# Patient Record
Sex: Male | Born: 2000 | Race: Black or African American | Hispanic: No | Marital: Single | Smoking: Never smoker
Health system: Southern US, Community
[De-identification: ages and names within clinical notes are randomized; demographics above are authoritative.]

## PROBLEM LIST (undated history)

## (undated) DIAGNOSIS — Z789 Other specified health status: Secondary | ICD-10-CM

## (undated) HISTORY — DX: Other specified health status: Z78.9

## (undated) HISTORY — PX: TIBIA FRACTURE SURGERY: SHX806

---

## 2020-05-01 ENCOUNTER — Ambulatory Visit (INDEPENDENT_AMBULATORY_CARE_PROVIDER_SITE_OTHER): Payer: 59 | Admitting: Orthopedic Surgery

## 2020-05-01 ENCOUNTER — Ambulatory Visit (INDEPENDENT_AMBULATORY_CARE_PROVIDER_SITE_OTHER): Payer: 59

## 2020-05-01 ENCOUNTER — Encounter: Payer: Self-pay | Admitting: Orthopedic Surgery

## 2020-05-01 DIAGNOSIS — M25561 Pain in right knee: Secondary | ICD-10-CM

## 2020-05-01 DIAGNOSIS — S83511A Sprain of anterior cruciate ligament of right knee, initial encounter: Secondary | ICD-10-CM

## 2020-05-01 NOTE — Progress Notes (Signed)
Office Visit Note   Patient: Casey Shaw           Date of Birth: January 04, 2001           MRN: [id number] Visit Date: 05/01/2020 Requested by: No referring provider defined for this encounter. PCP: Patient, No Pcp Per  Subjective: Chief Complaint  Patient presents with  . 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  He twisted his knee while trying to keep a teammate from getting the ball.  He did feel a pop.  Has lateral pain.  Painful for him to weight-bear.  Been doing ice and ibuprofen.  Reports some lateral pain.              ROS: All systems reviewed are negative as they relate to the chief complaint within the history of present illness.  Patient denies  fevers or chills.   Assessment & Plan: Visit Diagnoses:  1. Right knee pain, unspecified chronicity   2. New tear of anterior cruciate ligament, right, initial encounter     Plan: Impression is right knee ACL tear with effusion and possible lateral collateral ligament partial-thickness tearing.  Difficult to assess whether or not any meniscal damage is present.  No definite posterior lateral rotatory instability on exam today but that will need to be reassessed at his next clinic visit.  He does have anterior drawer and increased laxity anteriorly right versus left.  Plan is MRI scan with likely reconstruction to follow.  We will see him back after that study.  Casey Shaw was present for discussion of treatment plan.  Follow-Up Instructions: Return for after MRI.   Orders:  Orders Placed This Encounter  Procedures  . XR KNEE 3 VIEW RIGHT  . MR Knee Right w/o contrast   No orders of the defined types were placed in this encounter.     Procedures: No procedures performed   Clinical Data: No additional findings.  Objective: Vital Signs: There were no vitals taken for this visit.  Physical Exam:   Constitutional: Patient appears  well-developed HEENT:  Head: Normocephalic Eyes:EOM are normal Neck: Normal range of motion Cardiovascular: Normal rate Pulmonary/chest: Effort normal Neurologic: Patient is alert Skin: Skin is warm Psychiatric: Patient has normal mood and affect    Ortho Exam: Ortho exam demonstrates full active and passive range of motion of the left knee.  Has about 1 mm anterior drawer with solid endpoint on that side.  On the right-hand side he is got 3 to 4 mm anterior drawer with a very soft endpoint.  Collaterals are stable to varus valgus stress at 0 and 30 degrees.  Slightly more laxity to varus stress at 0 degrees on the right compared to the left.  Also the lateral collateral ligament is not as palpable as a discrete structure on the right knee compared to the left when he is placed under varus stress.  Pedal pulses palpable.  PCL intact.  Specialty Comments:  No specialty comments available.  Imaging: No results found.   PMFS History: There are no problems to display for this patient.  History reviewed. No pertinent past medical history.  History reviewed. No pertinent family history.  History reviewed. No pertinent surgical history. Social History   Occupational History  . Not on file  Tobacco Use  . Smoking status: Not on file  Substance and Sexual Activity  . Alcohol use: Not on file  .  Drug use: Not on file  . Sexual activity: Not on file

## 2020-05-07 ENCOUNTER — Ambulatory Visit: Payer: 59 | Admitting: Orthopedic Surgery

## 2020-05-08 ENCOUNTER — Encounter: Payer: Self-pay | Admitting: Radiology

## 2020-05-08 ENCOUNTER — Encounter: Payer: Self-pay | Admitting: Orthopedic Surgery

## 2020-05-09 ENCOUNTER — Other Ambulatory Visit: Payer: Self-pay

## 2020-05-09 ENCOUNTER — Ambulatory Visit (HOSPITAL_COMMUNITY)
Admission: RE | Admit: 2020-05-09 | Discharge: 2020-05-09 | Disposition: A | Discharge status: Home / Self Care | Payer: 59 | Source: Ambulatory Visit | Attending: Orthopedic Surgery | Admitting: Orthopedic Surgery

## 2020-05-09 DIAGNOSIS — M25561 Pain in right knee: Secondary | ICD-10-CM | POA: Insufficient documentation

## 2020-05-20 ENCOUNTER — Other Ambulatory Visit: Payer: Self-pay

## 2020-05-21 ENCOUNTER — Other Ambulatory Visit (HOSPITAL_COMMUNITY)
Admission: RE | Admit: 2020-05-21 | Discharge: 2020-05-21 | Disposition: A | Discharge status: Home / Self Care | Payer: 59 | Source: Ambulatory Visit | Attending: Orthopedic Surgery | Admitting: Orthopedic Surgery

## 2020-05-21 ENCOUNTER — Other Ambulatory Visit: Payer: Self-pay

## 2020-05-21 DIAGNOSIS — Z20822 Contact with and (suspected) exposure to covid-19: Secondary | ICD-10-CM | POA: Insufficient documentation

## 2020-05-21 DIAGNOSIS — Z01812 Encounter for preprocedural laboratory examination: Secondary | ICD-10-CM | POA: Insufficient documentation

## 2020-05-21 DIAGNOSIS — G8918 Other acute postprocedural pain: Secondary | ICD-10-CM

## 2020-05-21 LAB — SARS CORONAVIRUS 2 (TAT 6-24 HRS): SARS Coronavirus 2: NEGATIVE

## 2020-05-21 NOTE — Progress Notes (Signed)
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  He will be flying back home on 05/31/20.  He is coming into the office at 10:30 on 05/30/20 with Dr August Saucer for his post op visit. Dr August Saucer is requesting for patient to have doppler prior to this appointment to make sure he does not have DVT since he will be flying within a week of surgery. Can we please get him scheduled first thing on 07/22 for doppler so results are completed prior to his appointment with Dr August Saucer.

## 2020-05-21 NOTE — Progress Notes (Signed)
Pt is scheduled at the [facility] at [facility] on July 22 at 800am with arrival 745am, pt is aware of appt

## 2020-05-23 ENCOUNTER — Other Ambulatory Visit: Payer: Self-pay

## 2020-05-23 ENCOUNTER — Encounter (HOSPITAL_COMMUNITY): Payer: Self-pay | Admitting: Orthopedic Surgery

## 2020-05-23 NOTE — Progress Notes (Signed)
Spoke with pt for pre-op call. Pt denies any medical history.   Covid test done 05/21/20 and it's negative. Pt states he's been in quarantine since the test was done and will continue until he comes to the hospital tomorrow. Pt states he's fully vaccinated.  ERAS ordered. Instructed pt not to eat food after midnight tonight, but may have clear liquids until 9:00 AM (reviewed clear liquids with pt). Pt unable to pick up an Ensure drink prior to surgery.

## 2020-05-24 ENCOUNTER — Encounter (HOSPITAL_COMMUNITY): Payer: Self-pay | Admitting: Orthopedic Surgery

## 2020-05-24 ENCOUNTER — Ambulatory Visit (HOSPITAL_COMMUNITY): Payer: 59

## 2020-05-24 ENCOUNTER — Ambulatory Visit (HOSPITAL_COMMUNITY): Payer: 59 | Admitting: Anesthesiology

## 2020-05-24 ENCOUNTER — Encounter (HOSPITAL_COMMUNITY)
Admission: RE | Disposition: A | Discharge status: Home / Self Care | Payer: Self-pay | Source: Home / Self Care | Attending: Orthopedic Surgery

## 2020-05-24 ENCOUNTER — Ambulatory Visit (HOSPITAL_COMMUNITY)
Admission: RE | Admit: 2020-05-24 | Discharge: 2020-05-24 | Disposition: A | Discharge status: Home / Self Care | Payer: 59 | Attending: Orthopedic Surgery | Admitting: Orthopedic Surgery

## 2020-05-24 DIAGNOSIS — S83511D Sprain of anterior cruciate ligament of right knee, subsequent encounter: Secondary | ICD-10-CM

## 2020-05-24 DIAGNOSIS — S83511A Sprain of anterior cruciate ligament of right knee, initial encounter: Secondary | ICD-10-CM | POA: Insufficient documentation

## 2020-05-24 DIAGNOSIS — Y9367 Activity, basketball: Secondary | ICD-10-CM | POA: Diagnosis not present

## 2020-05-24 DIAGNOSIS — S83281A Other tear of lateral meniscus, current injury, right knee, initial encounter: Secondary | ICD-10-CM | POA: Diagnosis present

## 2020-05-24 DIAGNOSIS — S83281D Other tear of lateral meniscus, current injury, right knee, subsequent encounter: Secondary | ICD-10-CM | POA: Diagnosis not present

## 2020-05-24 DIAGNOSIS — Z419 Encounter for procedure for purposes other than remedying health state, unspecified: Secondary | ICD-10-CM

## 2020-05-24 HISTORY — PX: ANTERIOR CRUCIATE LIGAMENT REPAIR: SHX115

## 2020-05-24 LAB — BASIC METABOLIC PANEL
Anion gap: 9 (ref 5–15)
BUN: 14 mg/dL (ref 6–20)
CO2: 28 mmol/L (ref 22–32)
Calcium: 9.8 mg/dL (ref 8.9–10.3)
Chloride: 102 mmol/L (ref 98–111)
Creatinine, Ser: 1.15 mg/dL (ref 0.61–1.24)
GFR calc Af Amer: >60 mL/min (ref >60)
GFR calc non Af Amer: >60 mL/min (ref >60)
Glucose, Bld: 88 mg/dL (ref 70–99)
Potassium: 4.1 mmol/L (ref 3.5–5.1)
Sodium: 139 mmol/L (ref 135–145)

## 2020-05-24 LAB — CBC
HCT: 47 % (ref 39.0–52.0)
Hemoglobin: 15.2 g/dL (ref 13.0–17.0)
MCH: 28.6 pg (ref 26.0–34.0)
MCHC: 32.3 g/dL (ref 30.0–36.0)
MCV: 88.5 fL (ref 80.0–100.0)
Platelets: 324 10*3/uL (ref 150–400)
RBC: 5.31 MIL/uL (ref 4.22–5.81)
RDW: 11.9 % (ref 11.5–15.5)
WBC: 3 10*3/uL — ABNORMAL LOW (ref 4.0–10.5)
nRBC: 0 % (ref 0.0–0.2)

## 2020-05-24 SURGERY — RECONSTRUCTION, KNEE, ACL
Anesthesia: Regional | Site: Knee | Laterality: Right

## 2020-05-24 MED ORDER — FENTANYL CITRATE (PF) 100 MCG/2ML IJ SOLN: 100.0000 ug | Freq: Once | INTRAMUSCULAR | Status: AC

## 2020-05-24 MED ORDER — BUPIVACAINE HCL (PF) 0.25 % IJ SOLN
INTRAMUSCULAR | Status: AC
  Filled 2020-05-24: qty 30

## 2020-05-24 MED ORDER — ROCURONIUM BROMIDE 10 MG/ML (PF) SYRINGE
PREFILLED_SYRINGE | INTRAVENOUS | Status: DC | PRN
  Administered 2020-05-24 (×2): 20 mg via INTRAVENOUS
  Administered 2020-05-24: 100 mg via INTRAVENOUS
  Administered 2020-05-24: 20 mg via INTRAVENOUS

## 2020-05-24 MED ORDER — OXYCODONE HCL 5 MG/5ML PO SOLN: 5.0000 mg | Freq: Once | ORAL | Status: AC | PRN

## 2020-05-24 MED ORDER — CLONIDINE HCL (ANALGESIA) 100 MCG/ML EP SOLN
EPIDURAL | Status: DC | PRN
  Administered 2020-05-24: 100 ug

## 2020-05-24 MED ORDER — ASPIRIN EC 81 MG PO TBEC
81.0000 mg | DELAYED_RELEASE_TABLET | Freq: Two times a day (BID) | ORAL | 0 refills | Status: AC
Start: 2020-05-24 — End: 2021-05-24

## 2020-05-24 MED ORDER — EPINEPHRINE PF 1 MG/ML IJ SOLN
INTRAMUSCULAR | Status: AC
  Filled 2020-05-24: qty 5

## 2020-05-24 MED ORDER — MEPERIDINE HCL 25 MG/ML IJ SOLN: 6.2500 mg | INTRAMUSCULAR | Status: DC | PRN

## 2020-05-24 MED ORDER — EPINEPHRINE PF 1 MG/ML IJ SOLN
INTRAMUSCULAR | Status: DC | PRN
  Administered 2020-05-24: .15 mL
  Administered 2020-05-24 (×3): 1 mg

## 2020-05-24 MED ORDER — FENTANYL CITRATE (PF) 100 MCG/2ML IJ SOLN
INTRAMUSCULAR | Status: AC
  Administered 2020-05-24: 100 ug via INTRAVENOUS
  Filled 2020-05-24: qty 2

## 2020-05-24 MED ORDER — LACTATED RINGERS IV SOLN: INTRAVENOUS | Status: DC

## 2020-05-24 MED ORDER — PHENYLEPHRINE HCL-NACL 10-0.9 MG/250ML-% IV SOLN
INTRAVENOUS | Status: DC | PRN
Start: 2020-05-24 — End: 2020-05-24
  Administered 2020-05-24: 10 ug/min via INTRAVENOUS

## 2020-05-24 MED ORDER — CEFAZOLIN SODIUM-DEXTROSE 2-4 GM/100ML-% IV SOLN
INTRAVENOUS | Status: AC
  Filled 2020-05-24: qty 100

## 2020-05-24 MED ORDER — MIDAZOLAM HCL 2 MG/2ML IJ SOLN
INTRAMUSCULAR | Status: AC
  Filled 2020-05-24: qty 2

## 2020-05-24 MED ORDER — CHLORHEXIDINE GLUCONATE 0.12 % MT SOLN: 15.0000 mL | Freq: Once | OROMUCOSAL | Status: AC

## 2020-05-24 MED ORDER — POVIDONE-IODINE 7.5 % EX SOLN
Freq: Once | CUTANEOUS | Status: AC
  Filled 2020-05-24: qty 118

## 2020-05-24 MED ORDER — CEFAZOLIN SODIUM-DEXTROSE 2-4 GM/100ML-% IV SOLN
2.0000 g | INTRAVENOUS | Status: AC
  Administered 2020-05-24: 2 g via INTRAVENOUS

## 2020-05-24 MED ORDER — METHOCARBAMOL 500 MG PO TABS: 500.0000 mg | ORAL_TABLET | Freq: Three times a day (TID) | ORAL | 0 refills | Status: AC | PRN

## 2020-05-24 MED ORDER — ACETAMINOPHEN 10 MG/ML IV SOLN
INTRAVENOUS | Status: DC | PRN
Start: 2020-05-24 — End: 2020-05-24
  Administered 2020-05-24: 1000 mg via INTRAVENOUS

## 2020-05-24 MED ORDER — VANCOMYCIN HCL 1000 MG IV SOLR
INTRAVENOUS | Status: DC | PRN
  Administered 2020-05-24: 1000 mg
  Administered 2020-05-24: 500 mg

## 2020-05-24 MED ORDER — KETOROLAC TROMETHAMINE 30 MG/ML IJ SOLN
30.0000 mg | Freq: Once | INTRAMUSCULAR | Status: AC | PRN
  Administered 2020-05-24: 30 mg via INTRAVENOUS

## 2020-05-24 MED ORDER — KETOROLAC TROMETHAMINE 30 MG/ML IJ SOLN
INTRAMUSCULAR | Status: AC
  Filled 2020-05-24: qty 1

## 2020-05-24 MED ORDER — PHENYLEPHRINE 40 MCG/ML (10ML) SYRINGE FOR IV PUSH (FOR BLOOD PRESSURE SUPPORT)
PREFILLED_SYRINGE | INTRAVENOUS | Status: AC
  Filled 2020-05-24: qty 10

## 2020-05-24 MED ORDER — SODIUM CHLORIDE 0.9 % IR SOLN
Status: DC | PRN
  Administered 2020-05-24 (×6): 3000 mL

## 2020-05-24 MED ORDER — ORAL CARE MOUTH RINSE: 15.0000 mL | Freq: Once | OROMUCOSAL | Status: AC

## 2020-05-24 MED ORDER — VANCOMYCIN HCL 500 MG IV SOLR
INTRAVENOUS | Status: AC
  Filled 2020-05-24: qty 500

## 2020-05-24 MED ORDER — SUGAMMADEX SODIUM 200 MG/2ML IV SOLN
INTRAVENOUS | Status: DC | PRN
  Administered 2020-05-24: 50 mg via INTRAVENOUS
  Administered 2020-05-24 (×2): 100 mg via INTRAVENOUS

## 2020-05-24 MED ORDER — MIDAZOLAM HCL 2 MG/2ML IJ SOLN: 2.0000 mg | Freq: Once | INTRAMUSCULAR | Status: AC

## 2020-05-24 MED ORDER — ROCURONIUM BROMIDE 10 MG/ML (PF) SYRINGE
PREFILLED_SYRINGE | INTRAVENOUS | Status: AC
  Filled 2020-05-24: qty 20

## 2020-05-24 MED ORDER — ONDANSETRON HCL 4 MG/2ML IJ SOLN
INTRAMUSCULAR | Status: DC | PRN
  Administered 2020-05-24: 4 mg via INTRAVENOUS

## 2020-05-24 MED ORDER — ACETAMINOPHEN 10 MG/ML IV SOLN
INTRAVENOUS | Status: AC
  Filled 2020-05-24: qty 100

## 2020-05-24 MED ORDER — PHENYLEPHRINE 40 MCG/ML (10ML) SYRINGE FOR IV PUSH (FOR BLOOD PRESSURE SUPPORT)
PREFILLED_SYRINGE | INTRAVENOUS | Status: DC | PRN
  Administered 2020-05-24 (×3): 40 ug via INTRAVENOUS
  Administered 2020-05-24: 80 ug via INTRAVENOUS
  Administered 2020-05-24 (×2): 40 ug via INTRAVENOUS

## 2020-05-24 MED ORDER — DEXAMETHASONE SODIUM PHOSPHATE 10 MG/ML IJ SOLN
INTRAMUSCULAR | Status: DC | PRN
  Administered 2020-05-24: 10 mg via INTRAVENOUS

## 2020-05-24 MED ORDER — MORPHINE SULFATE (PF) 4 MG/ML IV SOLN
INTRAVENOUS | Status: AC
  Filled 2020-05-24: qty 2

## 2020-05-24 MED ORDER — PROMETHAZINE HCL 25 MG/ML IJ SOLN: 6.2500 mg | INTRAMUSCULAR | Status: DC | PRN

## 2020-05-24 MED ORDER — BUPIVACAINE HCL (PF) 0.25 % IJ SOLN
INTRAMUSCULAR | Status: DC | PRN
  Administered 2020-05-24: 30 mL
  Administered 2020-05-24: 15 mL

## 2020-05-24 MED ORDER — PROPOFOL 10 MG/ML IV BOLUS
INTRAVENOUS | Status: DC | PRN
  Administered 2020-05-24: 30 mg via INTRAVENOUS
  Administered 2020-05-24: 200 mg via INTRAVENOUS

## 2020-05-24 MED ORDER — HYDROMORPHONE HCL 1 MG/ML IJ SOLN
INTRAMUSCULAR | Status: AC
  Filled 2020-05-24: qty 1

## 2020-05-24 MED ORDER — MORPHINE SULFATE (PF) 4 MG/ML IV SOLN
INTRAVENOUS | Status: DC | PRN
  Administered 2020-05-24: 8 mg via INTRAVENOUS

## 2020-05-24 MED ORDER — POVIDONE-IODINE 10 % EX SWAB
2.0000 "application " | Freq: Once | CUTANEOUS | Status: AC
  Administered 2020-05-24: 2 via TOPICAL

## 2020-05-24 MED ORDER — DEXAMETHASONE SODIUM PHOSPHATE 10 MG/ML IJ SOLN
INTRAMUSCULAR | Status: AC
  Filled 2020-05-24: qty 1

## 2020-05-24 MED ORDER — DEXMEDETOMIDINE (PRECEDEX) IN NS 20 MCG/5ML (4 MCG/ML) IV SYRINGE
PREFILLED_SYRINGE | INTRAVENOUS | Status: AC
  Filled 2020-05-24: qty 5

## 2020-05-24 MED ORDER — DEXMEDETOMIDINE HCL 200 MCG/2ML IV SOLN
INTRAVENOUS | Status: DC | PRN
  Administered 2020-05-24 (×2): 8 ug via INTRAVENOUS
  Administered 2020-05-24: 4 ug via INTRAVENOUS

## 2020-05-24 MED ORDER — CLONIDINE HCL (ANALGESIA) 100 MCG/ML EP SOLN
EPIDURAL | Status: AC
  Filled 2020-05-24: qty 10

## 2020-05-24 MED ORDER — HYDROMORPHONE HCL 1 MG/ML IJ SOLN
0.2500 mg | INTRAMUSCULAR | Status: DC | PRN
  Administered 2020-05-24 (×2): 0.5 mg via INTRAVENOUS

## 2020-05-24 MED ORDER — VANCOMYCIN HCL 1000 MG IV SOLR
INTRAVENOUS | Status: AC
  Filled 2020-05-24: qty 1000

## 2020-05-24 MED ORDER — FENTANYL CITRATE (PF) 250 MCG/5ML IJ SOLN
INTRAMUSCULAR | Status: AC
  Filled 2020-05-24: qty 5

## 2020-05-24 MED ORDER — OXYCODONE HCL 5 MG PO TABS
5.0000 mg | ORAL_TABLET | Freq: Once | ORAL | Status: AC | PRN
  Administered 2020-05-24: 5 mg via ORAL

## 2020-05-24 MED ORDER — POVIDONE-IODINE 10 % EX SWAB: 2.0000 "application " | Freq: Once | CUTANEOUS | Status: DC

## 2020-05-24 MED ORDER — MIDAZOLAM HCL 2 MG/2ML IJ SOLN
INTRAMUSCULAR | Status: AC
  Administered 2020-05-24: 2 mg via INTRAVENOUS
  Filled 2020-05-24: qty 2

## 2020-05-24 MED ORDER — OXYCODONE-ACETAMINOPHEN 5-325 MG PO TABS: 1.0000 | ORAL_TABLET | ORAL | 0 refills | Status: AC | PRN

## 2020-05-24 MED ORDER — CHLORHEXIDINE GLUCONATE 0.12 % MT SOLN
OROMUCOSAL | Status: AC
  Administered 2020-05-24: 15 mL via OROMUCOSAL
  Filled 2020-05-24: qty 15

## 2020-05-24 MED ORDER — ONDANSETRON HCL 4 MG/2ML IJ SOLN
INTRAMUSCULAR | Status: AC
  Filled 2020-05-24: qty 2

## 2020-05-24 MED ORDER — DEXAMETHASONE SODIUM PHOSPHATE 10 MG/ML IJ SOLN
INTRAMUSCULAR | Status: DC | PRN
  Administered 2020-05-24: 10 mg

## 2020-05-24 MED ORDER — LIDOCAINE 2% (20 MG/ML) 5 ML SYRINGE
INTRAMUSCULAR | Status: DC | PRN
  Administered 2020-05-24: 20 mg via INTRAVENOUS

## 2020-05-24 MED ORDER — OXYCODONE HCL 5 MG PO TABS
ORAL_TABLET | ORAL | Status: AC
  Filled 2020-05-24: qty 1

## 2020-05-24 MED ORDER — HYDROMORPHONE HCL 1 MG/ML IJ SOLN: 0.2500 mg | INTRAMUSCULAR | Status: DC | PRN

## 2020-05-24 MED ORDER — ROPIVACAINE HCL 5 MG/ML IJ SOLN
INTRAMUSCULAR | Status: DC | PRN
  Administered 2020-05-24: 30 mL via PERINEURAL

## 2020-05-24 MED ORDER — 0.9 % SODIUM CHLORIDE (POUR BTL) OPTIME
TOPICAL | Status: DC | PRN
Start: 2020-05-24 — End: 2020-05-24
  Administered 2020-05-24: 1000 mL

## 2020-05-24 MED ORDER — PROPOFOL 10 MG/ML IV BOLUS
INTRAVENOUS | Status: AC
  Filled 2020-05-24: qty 20

## 2020-05-24 MED ORDER — FENTANYL CITRATE (PF) 250 MCG/5ML IJ SOLN
INTRAMUSCULAR | Status: DC | PRN
  Administered 2020-05-24 (×3): 50 ug via INTRAVENOUS
  Administered 2020-05-24: 100 ug via INTRAVENOUS

## 2020-05-24 MED ORDER — LIDOCAINE 2% (20 MG/ML) 5 ML SYRINGE
INTRAMUSCULAR | Status: AC
  Filled 2020-05-24: qty 5

## 2020-05-24 MED ORDER — DEXAMETHASONE SODIUM PHOSPHATE 10 MG/ML IJ SOLN
INTRAMUSCULAR | Status: DC | PRN
Start: 2020-05-24 — End: 2020-05-24

## 2020-05-24 SURGICAL SUPPLY — 95 items
ANCHOR BUTTON TIGHTROPE W/FC3 (ORTHOPEDIC DISPOSABLE SUPPLIES) ×3 IMPLANT
BANDAGE ESMARK 6X9 LF (GAUZE/BANDAGES/DRESSINGS) ×1 IMPLANT
BLADE CUTTER GATOR 3.5 (BLADE) ×3 IMPLANT
BLADE EXCALIBUR 4.0MM X 13CM (MISCELLANEOUS) ×1
BLADE EXCALIBUR 4.0X13 (MISCELLANEOUS) ×2 IMPLANT
BLADE SURG 10 STRL SS (BLADE) ×3 IMPLANT
BLADE SURG 15 STRL LF DISP TIS (BLADE) ×3 IMPLANT
BLADE SURG 15 STRL SS (BLADE) ×6
BNDG ELASTIC 6X15 VLCR STRL LF (GAUZE/BANDAGES/DRESSINGS) ×3 IMPLANT
BNDG ESMARK 6X9 LF (GAUZE/BANDAGES/DRESSINGS) ×3
BURR OVAL 8 FLU 4.0MM X 13CM (MISCELLANEOUS) ×1
BURR OVAL 8 FLU 4.0X13 (MISCELLANEOUS) ×2 IMPLANT
CLOSURE STERI-STRIP 1/2X4 (GAUZE/BANDAGES/DRESSINGS) ×2
CLSR STERI-STRIP ANTIMIC 1/2X4 (GAUZE/BANDAGES/DRESSINGS) ×4 IMPLANT
CNTNR URN SCR LID CUP LEK RST (MISCELLANEOUS) ×1 IMPLANT
CONT SPEC 4OZ STRL OR WHT (MISCELLANEOUS) ×2
COVER SURGICAL LIGHT HANDLE (MISCELLANEOUS) ×3 IMPLANT
COVER WAND RF STERILE (DRAPES) IMPLANT
CUFF TOURN SGL QUICK 34 (TOURNIQUET CUFF)
CUFF TOURN SGL QUICK 42 (TOURNIQUET CUFF) IMPLANT
CUFF TRNQT CYL 34X4.125X (TOURNIQUET CUFF) IMPLANT
CUTTER BONE 4.0MM X 13CM (MISCELLANEOUS) ×3 IMPLANT
DECANTER SPIKE VIAL GLASS SM (MISCELLANEOUS) ×3 IMPLANT
DRAPE ARTHROSCOPY W/POUCH 114 (DRAPES) ×3 IMPLANT
DRAPE C-ARM 42X72 X-RAY (DRAPES) ×3 IMPLANT
DRAPE INCISE IOBAN 66X45 STRL (DRAPES) ×3 IMPLANT
DRAPE ORTHO SPLIT 77X108 STRL (DRAPES) ×2
DRAPE SURG ORHT 6 SPLT 77X108 (DRAPES) ×1 IMPLANT
DRAPE U-SHAPE 47X51 STRL (DRAPES) ×3 IMPLANT
DRSG MEPILEX BORDER 4X8 (GAUZE/BANDAGES/DRESSINGS) ×3 IMPLANT
DRSG PAD ABDOMINAL 8X10 ST (GAUZE/BANDAGES/DRESSINGS) ×9 IMPLANT
DRSG TEGADERM 4X4.5 CHG (GAUZE/BANDAGES/DRESSINGS) ×12 IMPLANT
DW OUTFLOW CASSETTE/TUBE SET (MISCELLANEOUS) ×3 IMPLANT
ELECT REM PT RETURN 9FT ADLT (ELECTROSURGICAL) ×3
ELECTRODE REM PT RTRN 9FT ADLT (ELECTROSURGICAL) ×1 IMPLANT
FIBERSTICK 2 (SUTURE) ×3 IMPLANT
GAUZE SPONGE 4X4 12PLY STRL (GAUZE/BANDAGES/DRESSINGS) ×6 IMPLANT
GAUZE SPONGE 4X4 12PLY STRL LF (GAUZE/BANDAGES/DRESSINGS) ×6 IMPLANT
GAUZE XEROFORM 1X8 LF (GAUZE/BANDAGES/DRESSINGS) ×3 IMPLANT
GLOVE BIO SURGEON ST LM GN SZ9 (GLOVE) ×3 IMPLANT
GLOVE BIO SURGEON STRL SZ7 (GLOVE) ×12 IMPLANT
GLOVE BIOGEL PI IND STRL 8 (GLOVE) ×1 IMPLANT
GLOVE BIOGEL PI INDICATOR 8 (GLOVE) ×2
GLOVE ECLIPSE 8.0 STRL XLNG CF (GLOVE) ×3 IMPLANT
GOWN STRL REUS W/ TWL LRG LVL3 (GOWN DISPOSABLE) ×3 IMPLANT
GOWN STRL REUS W/ TWL XL LVL3 (GOWN DISPOSABLE) ×1 IMPLANT
GOWN STRL REUS W/TWL LRG LVL3 (GOWN DISPOSABLE) ×6
GOWN STRL REUS W/TWL XL LVL3 (GOWN DISPOSABLE) ×2
GUIDEPIN REAMER CUTTER 11MM (INSTRUMENTS) ×3 IMPLANT
HARVESTER TENDON QUADPRO 11 (ORTHOPEDIC DISPOSABLE SUPPLIES) ×3 IMPLANT
IMMOBILIZER KNEE 19 UNV (ORTHOPEDIC SUPPLIES) ×3 IMPLANT
IMPL TIGHTROP ABS ACL FIBERTG (Orthopedic Implant) ×1 IMPLANT
IMPL TIGHTROPE ABS ACL FIBERTG (Orthopedic Implant) ×3 IMPLANT
KIT BASIN OR (CUSTOM PROCEDURE TRAY) ×3 IMPLANT
KIT BIOCARTILAGE DEL W/SYRINGE (KITS) ×3 IMPLANT
KIT BIOCARTILAGE LG JOINT MIX (KITS) ×3 IMPLANT
KIT BUTTON TIGHTROPE ABS 8X12 (Anchor) ×3 IMPLANT
KIT TURNOVER KIT B (KITS) ×3 IMPLANT
MANIFOLD NEPTUNE II (INSTRUMENTS) ×3 IMPLANT
NEEDLE 18GX1X1/2 (RX/OR ONLY) (NEEDLE) ×6 IMPLANT
NS IRRIG 1000ML POUR BTL (IV SOLUTION) ×3 IMPLANT
PACK ARTHROSCOPY DSU (CUSTOM PROCEDURE TRAY) ×3 IMPLANT
PAD ARMBOARD 7.5X6 YLW CONV (MISCELLANEOUS) ×6 IMPLANT
PAD CAST 4YDX4 CTTN HI CHSV (CAST SUPPLIES) ×1 IMPLANT
PADDING CAST COTTON 4X4 STRL (CAST SUPPLIES) ×2
PADDING CAST COTTON 6X4 STRL (CAST SUPPLIES) ×9 IMPLANT
PENCIL BUTTON HOLSTER BLD 10FT (ELECTRODE) IMPLANT
PUTTY DBM ALLOSYNC PURE 5CC (Putty) ×3 IMPLANT
SPONGE LAP 4X18 RFD (DISPOSABLE) ×6 IMPLANT
SUCTION FRAZIER HANDLE 10FR (MISCELLANEOUS) ×2
SUCTION TUBE FRAZIER 10FR DISP (MISCELLANEOUS) ×1 IMPLANT
SUT 2 FIBERLOOP 20 STRT BLUE (SUTURE) ×3
SUT ETHILON 3 0 PS 1 (SUTURE) ×9 IMPLANT
SUT MENISCAL KIT (KITS) IMPLANT
SUT MNCRL AB 3-0 PS2 27 (SUTURE) ×6 IMPLANT
SUT MNCRL AB 4-0 PS2 18 (SUTURE) ×6 IMPLANT
SUT VIC AB 0 CT1 27 (SUTURE) ×4
SUT VIC AB 0 CT1 27XBRD ANBCTR (SUTURE) ×2 IMPLANT
SUT VIC AB 1 CT1 27 (SUTURE) ×6
SUT VIC AB 1 CT1 27XBRD ANBCTR (SUTURE) ×3 IMPLANT
SUT VIC AB 2-0 CT1 27 (SUTURE) ×8
SUT VIC AB 2-0 CT1 TAPERPNT 27 (SUTURE) ×4 IMPLANT
SUTURE 2 FIBERLOOP 20 STRT BLU (SUTURE) ×1 IMPLANT
SYR 30ML LL (SYRINGE) ×6 IMPLANT
SYR 3ML LL SCALE MARK (SYRINGE) ×6 IMPLANT
SYR BULB IRRIG 60ML STRL (SYRINGE) ×3 IMPLANT
SYR TB 1ML LUER SLIP (SYRINGE) ×6 IMPLANT
SYS INTERNAL BRACE KNEE (Miscellaneous) ×3 IMPLANT
SYSTEM INTERNAL BRACE KNEE (Miscellaneous) ×1 IMPLANT
TOWEL GREEN STERILE (TOWEL DISPOSABLE) ×6 IMPLANT
TOWEL GREEN STERILE FF (TOWEL DISPOSABLE) ×3 IMPLANT
TUBING ARTHROSCOPY IRRIG 16FT (MISCELLANEOUS) ×3 IMPLANT
UNDERPAD 30X36 HEAVY ABSORB (UNDERPADS AND DIAPERS) ×3 IMPLANT
WATER STERILE IRR 1000ML POUR (IV SOLUTION) ×3 IMPLANT
WRAP KNEE MAXI GEL POST OP (GAUZE/BANDAGES/DRESSINGS) ×6 IMPLANT

## 2020-05-24 NOTE — Anesthesia Procedure Notes (Signed)
Anesthesia Regional Block: Adductor canal block   Pre-Anesthetic Checklist: ,, timeout performed, Correct Patient, Correct Site, Correct Laterality, Correct Procedure, Correct Position, site marked, Risks and benefits discussed,  Surgical consent,  Pre-op evaluation,  At surgeon's request and post-op pain management  Laterality: Right  Prep: Maximum Sterile Barrier Precautions used, chloraprep       Needles:  Injection technique: Single-shot  Needle Type: Echogenic Stimulator Needle     Needle Length: 9cm  Needle Gauge: 22     Additional Needles:   Procedures:,,,, ultrasound used (permanent image in chart),,,,  Narrative:  Start time: 05/24/2020 9:48 AM End time: 05/24/2020 9:52 AM Injection made incrementally with aspirations every 5 mL.  Performed by: Personally  Anesthesiologist: Lannie Fields, DO  Additional Notes: Monitors applied. No increased pain on injection. No increased resistance to injection. Injection made in 5cc increments. Good needle visualization. Patient tolerated procedure well.

## 2020-05-24 NOTE — Anesthesia Preprocedure Evaluation (Addendum)
Anesthesia Evaluation  Patient identified by MRN, date of birth, ID band Patient awake    Reviewed: Allergy & Precautions, NPO status , Patient's Chart, lab work & pertinent test results  Airway Mallampati: I  TM Distance: >3 FB Neck ROM: Full    Dental no notable dental hx. (+) Teeth Intact, Dental Advisory Given   Pulmonary neg pulmonary ROS,    Pulmonary exam normal [place] sounds clear to auscultation       Cardiovascular negative cardio ROS Normal cardiovascular exam Rhythm:Regular Rate:Normal     Neuro/Psych negative neurological ROS  negative psych ROS   GI/Hepatic negative GI ROS, Neg liver ROS,   Endo/Other  negative endocrine ROS  Renal/GU negative Renal ROS  negative genitourinary   Musculoskeletal Right knee ACL and lateral meniscus tear   Abdominal Normal abdominal exam  (+)   Peds negative pediatric ROS (+)  Hematology negative hematology ROS (+)   Anesthesia Other Findings Plays basketball for [employer]  Reproductive/Obstetrics negative OB ROS                            Anesthesia Physical Anesthesia Plan  ASA: I  Anesthesia Plan: General and Regional   Post-op Pain Management: GA combined w/ Regional for post-op pain   Induction: Intravenous  PONV Risk Score and Plan: 2 and Ondansetron, Dexamethasone, Midazolam and Treatment may vary due to age or medical condition  Airway Management Planned: Oral ETT  Additional Equipment: None  Intra-op Plan:   Post-operative Plan: Extubation in OR  Informed Consent: I have reviewed the patients History and Physical, chart, labs and discussed the procedure including the risks, benefits and alternatives for the proposed anesthesia with the patient or authorized representative who has indicated his/her understanding and acceptance.     Dental advisory given  Plan Discussed with: CRNA  Anesthesia Plan Comments:          Anesthesia Quick Evaluation

## 2020-05-24 NOTE — Transfer of Care (Signed)
Immediate Anesthesia Transfer of Care Note  Patient: Casey Shaw  Procedure(s) Performed: RIGHT KNEE ANTERIOR CRUCIATE LIGAMENT RECONSTRUCTION WITH QUAD AUTOGRAFT, MENISCAL DEBRIDEMENT (Right Knee)  Patient Location: PACU  Anesthesia Type:General  Level of Consciousness: oriented, drowsy and patient cooperative  Airway & Oxygen Therapy: Patient Spontanous Breathing and Patient connected to nasal cannula oxygen  Post-op Assessment: Report given to RN and Post -op Vital signs reviewed and stable  Post vital signs: Reviewed  Last Vitals:  Vitals Value Taken Time  BP 138/70 05/24/20 1657  Temp    Pulse 89 05/24/20 1658  Resp 24 05/24/20 1658  SpO2 99 % 05/24/20 1658  Vitals shown include unvalidated device data.  Last Pain:  Vitals:   05/24/20 1100  TempSrc:   PainSc: 0-No pain         Complications: No complications documented.

## 2020-05-24 NOTE — Anesthesia Procedure Notes (Signed)
Procedure Name: Intubation Date/Time: 05/24/2020 12:54 PM Performed by: Lovie Chol, CRNA Pre-anesthesia Checklist: Patient identified, Emergency Drugs available, Suction available and Patient being monitored Patient Re-evaluated:Patient Re-evaluated prior to induction Oxygen Delivery Method: Circle System Utilized Preoxygenation: Pre-oxygenation with 100% oxygen Induction Type: IV induction Ventilation: Mask ventilation without difficulty Laryngoscope Size: Miller and 3 Grade View: Grade I Tube type: Oral Tube size: 8.0 mm Number of attempts: 1 Airway Equipment and Method: Stylet Placement Confirmation: ETT inserted through vocal cords under direct vision,  positive ETCO2 and [place] sounds checked- equal and bilateral Secured at: 24 cm Tube secured with: Tape Dental Injury: Teeth and Oropharynx as per pre-operative assessment

## 2020-05-24 NOTE — Progress Notes (Signed)
Orthopedic Tech Progress Note Patient Details:  Casey Shaw 02/13/2001 [id number]  Ortho Devices Type of Ortho Device: Crutches Ortho Device/Splint Interventions: Adjustment, Ordered   Post Interventions Patient Tolerated: Well Instructions Provided: Care of device   Casey Shaw [name] 05/24/2020, 6:17 PM

## 2020-05-24 NOTE — Brief Op Note (Signed)
   05/24/2020  5:04 PM  PATIENT:  Moxon Doleman  19 y.o. male  PRE-OPERATIVE DIAGNOSIS:  right knee anterior cruciate ligament tear, lateral meniscal tear  POST-OPERATIVE DIAGNOSIS:  right knee anterior cruciate ligament tear, lateral meniscal tear  PROCEDURE:  Procedure(s): RIGHT KNEE ANTERIOR CRUCIATE LIGAMENT RECONSTRUCTION WITH QUAD AUTOGRAFT, MENISCAL DEBRIDEMENT  SURGEON:  Surgeon(s): August Saucer, Corrie Mckusick, MD  ASSISTANT: magnant pa  ANESTHESIA:   general  EBL: 25 ml    Total I/O In: 2000 [I.V.:1800; IV Piggyback:200] Out: 25 [Blood:25]  BLOOD ADMINISTERED: none  DRAINS: none   LOCAL MEDICATIONS USED:  vanco mso4 marcaine clonidine  SPECIMEN:  No Specimen  COUNTS:  YES  TOURNIQUET:   Total Tourniquet Time Documented: Thigh (Right) - 42 minutes Total: Thigh (Right) - 42 minutes   DICTATION: .Other Dictation: Dictation Number done  PLAN OF CARE: Discharge to home after PACU  PATIENT DISPOSITION:  PACU - hemodynamically stable

## 2020-05-24 NOTE — H&P (Signed)
Casey Shaw is an 19 y.o. male.   Chief Complaint: Right knee pain HPI: Casey Shaw is a 19 year old patient who injured his right knee playing basketball over 2 weeks ago.  MRI scan shows ACL tear and lateral meniscal tear.  He has been doing rehabilitation and has achieved full range of motion prior to knee surgery.  Denies any personal or family history of DVT or pulmonary embolism.  Presents now for operative management after explanation risk benefits.  Past Medical History:  Diagnosis Date  . Medical history non-contributory     Past Surgical History:  Procedure Laterality Date  . TIBIA FRACTURE SURGERY Left     History reviewed. No pertinent family history. Social History:  reports that he has never smoked. He has never used smokeless tobacco. He reports that he does not drink alcohol and does not use drugs.  Allergies: No Known Allergies  No medications prior to admission.    Results for orders placed or performed during the hospital encounter of 05/24/20 (from the past 48 hour(s))  CBC     Status: Abnormal   Collection Time: 05/24/20 10:30 AM  Result Value Ref Range   WBC 3.0 (L) 4.0 - 10.5 K/uL   RBC 5.31 4.22 - 5.81 MIL/uL   Hemoglobin 15.2 13.0 - 17.0 g/dL   HCT 14.4 39 - 52 %   MCV 88.5 80.0 - 100.0 fL   MCH 28.6 26.0 - 34.0 pg   MCHC 32.3 30.0 - 36.0 g/dL   RDW 31.5 40.0 - 86.7 %   Platelets 324 150 - 400 K/uL   nRBC 0.0 0.0 - 0.2 %    Comment: Performed at [facility], [address] [place], [place] [address]  Basic metabolic panel     Status: None   Collection Time: 05/24/20 10:30 AM  Result Value Ref Range   Sodium 139 135 - 145 mmol/L   Potassium 4.1 3.5 - 5.1 mmol/L   Chloride 102 98 - 111 mmol/L   CO2 28 22 - 32 mmol/L   Glucose, Bld 88 70 - 99 mg/dL    Comment: Glucose reference range applies only to samples taken after fasting for at least 8 hours.   BUN 14 6 - 20 mg/dL   Creatinine, Ser 9.32 0.61 - 1.24 mg/dL   Calcium 9.8 8.9 - 67.1 mg/dL    GFR calc non Af Amer >60 >60 mL/min   GFR calc Af Amer >60 >60 mL/min   Anion gap 9 5 - 15    Comment: Performed at [facility], [address], [place], [place] [address]   No results found.  Review of Systems  Musculoskeletal: Positive for arthralgias.  All other systems reviewed and are negative.   Blood pressure (!) 122/58, pulse (!) 53, temperature 98.2 F (36.8 C), temperature source Oral, resp. rate 13, height 6\' 8"  (2.032 m), weight 104.3 kg, SpO2 99 %. Physical Exam Vitals reviewed.  HENT:     Head: Normocephalic.     Nose: Nose normal.     Mouth/Throat:     Mouth: Mucous membranes are moist.  Eyes:     Pupils: Pupils are equal, round, and reactive to light.  Cardiovascular:     Rate and Rhythm: Normal rate.  Pulmonary:     Effort: Pulmonary effort is normal.  Abdominal:     General: Abdomen is flat.  Musculoskeletal:     Cervical back: Normal range of motion.  Skin:    General: Skin  is warm.     Capillary Refill: Capillary refill takes less than 2 seconds.  Neurological:     General: No focal deficit present.     Mental Status: He is alert.  Psychiatric:        Mood and Affect: Mood normal.   Examination of the right knee demonstrates mild effusion.  Extensor mechanism is intact.  Collaterals are stable.  ACL is out with positive Lachman and anterior drawer.  PCL intact.  No posterior lateral rotatory instability is present.  Pedal pulses palpable.  Ankle dorsiflexion intact.  Negative Homans.  No calf tenderness to direct palpation.  Patient has lateral greater than medial joint line tenderness.  Range of motion 0-1 25.  Assessment/Plan Impression is right knee ACL tear with lateral meniscal tear.  Plan is ACL reconstruction using quad autograft.  Meniscal resection versus repair.  Risk benefits are discussed including but not limited to infection nerve vessel damage knee stiffness potential development for arthritis down the road as well as further knee  problems requiring more surgery in the future.  All questions answered.  Burnard Bunting, MD 05/24/2020, 12:13 PM

## 2020-05-25 NOTE — Anesthesia Postprocedure Evaluation (Signed)
Anesthesia Post Note  Patient: Casey Shaw  Procedure(s) Performed: RIGHT KNEE ANTERIOR CRUCIATE LIGAMENT RECONSTRUCTION WITH QUAD AUTOGRAFT, MENISCAL DEBRIDEMENT (Right Knee)     Patient location during evaluation: PACU Anesthesia Type: General and Regional Level of consciousness: awake and alert Pain management: pain level controlled Vital Signs Assessment: post-procedure vital signs reviewed and stable Respiratory status: spontaneous breathing Cardiovascular status: stable Anesthetic complications: no   No complications documented.  Last Vitals:  Vitals:   05/24/20 1745 05/24/20 1800  BP: 134/78 (!) 143/96  Pulse: 79 74  Resp: 17 18  Temp: (!) 36.2 C   SpO2: 98% 100%    Last Pain:  Vitals:   05/24/20 1800  TempSrc:   PainSc: Asleep                 Lewie Loron

## 2020-05-25 NOTE — Op Note (Signed)
NAMEBALRAJ, BRAYFIELD MEDICAL RECORD ZO:[id number] ACCOUNT [id number] DATE OF BIRTH:February 10, 2001 FACILITY: [facility] LOCATION: [facility] PHYSICIAN:Emaline Karnes Diamantina Providence, MD  OPERATIVE REPORT  DATE OF PROCEDURE:  05/24/2020  PREOPERATIVE DIAGNOSIS:  Right knee anterior cruciate ligament tear, lateral meniscal tear.  POSTOPERATIVE DIAGNOSIS:  Right knee anterior cruciate ligament tear, lateral meniscal tear.  PROCEDURE:  Right knee anterior cruciate ligament reconstruction using quad autograft dual Endobutton technique from [employer] and partial lateral meniscectomy.  SURGEON:  Cammy Copa, MD  ASSISTANT:  Karenann Cai, PA.  INDICATIONS:  The patient is a 19 year old patient with right knee instability and lateral meniscal tear, presents for operative management after explanation of risks and benefits.  PROCEDURE IN DETAIL:  The patient was brought to the operating room where a general anesthetic was induced.  Preoperative antibiotics administered.  Timeout was called.  The patient's right leg was examined under anesthesia was found to have full range  of motion.  No hyperextension, 125-130 of flexion with good stability to varus and valgus stress.  No posterolateral rotatory instability was noted.  The patient did have ACL laxity with anterior drawer and positive Lachman.  The right leg was  prescrubbed with alcohol and Betadine, allowed to air dry, prepped with DuraPrep solution and draped in a sterile manner.  Ioban used to cover the operative field.  Timeout was called.  Leg was elevated, examined with the Esmarch wrap.  The tourniquet  was inflated to 300 mmHg.  Total time 42 minutes.  Incision made at the superior pole of the patella extending proximally.  Skin and subcutaneous tissue were sharply divided.  Quad tendon was identified.  A very broad tendon was present.  Based on the  patient's size, an 11 mm graft was utilized.  This was first harvested from the superior pole of the patella and  the cylindrical harvester from [employer] was used to harvest an 80 mm graft.  Again, the patient was 6 feet 8 inches.  Prepared on the back  table by [facility] using a dual Endobutton technique.  Once the graft was prepared, placed under tension and covered with a vancomycin soaked sponge.    The quad tendon was repaired side-to-side using #1 Vicryl suture.  Skin was closed using interrupted inverted 0 Vicryl suture and 2-0 Vicryl suture and 3-0 Monocryl.  The tourniquet was released.  Next, the anterior inferolateral and anterior  inferomedial portals were established.  Diagnostic arthroscopy was performed.  The patient had intact patellofemoral joint.  ACL was torn.  PCL was intact.  The medial meniscus was intact with intact articular cartilage on the medial tibial plateau and  medial femoral condyle.  Lateral side had a tear of that lateral meniscus, but no vertical component.  The lateral meniscus was probed on the superior and inferior surface.  Radial tear involving about 30% anterior, posterior width of the midportion of  that lateral meniscus.  Debrided back to a stable rim using combination of basket punch and shaver.  There were some mild grade I changes on that lateral femoral condyle, but otherwise joint surface was intact.  Next notchplasty was performed.  Using a  FlipCutter after the graft size was a half on the femur and 11.5 on the tibia.  The FlipCutter was utilized to drill the tunnels.  Drilled 110 degrees on the femur and 60 degrees on the tibia.  The tunnel was placed at the 9 o'clock position with a 1-2  mm back wall present on the tibial side, placed in  the posterior aspect of the native ACL footprint.  A graft was passed and fluoroscopy was used to confirm flipping the end of the button.  Next, the graft was then placed into the femoral tunnel.  We  achieved about 18 mm of graft in each tunnel with bone graft placed.  The graft was tensioned in full extension.  This gave very good  stability comparable to his left hand side.  Next, a thorough irrigation of the knee joint was performed.  Instruments  were removed.  It should be noted that the tibial side was reinforced with a SwiveLock.  Incision was made there for drilling of the tibial tunnel and placement of the SwiveLock.  All these incisions were irrigated and then closed using a 0 Vicryl  suture, 2-0 Vicryl suture and 3-0 nylon.  The harvest incision had already been closed.  A solution of Marcaine, morphine, clonidine injected into the incisions.  The patient tolerated the procedure well without immediate complication.  Impervious  dressings plus bulky Ace wrap and knee immobilizer applied.  The patient tolerated the procedure well without immediate complications.  He was transferred to the recovery room in stable condition.  Luke's assistance was required at all times during the  case for retraction, opening and closing.  His assistance was of medical necessity for tissue mobilization.  PN/NUANCE  D:05/24/2020 T:05/25/2020 JOB:[id number]

## 2020-05-27 ENCOUNTER — Telehealth: Payer: Self-pay

## 2020-05-27 ENCOUNTER — Encounter (HOSPITAL_COMMUNITY): Payer: Self-pay | Admitting: Orthopedic Surgery

## 2020-05-27 DIAGNOSIS — G8918 Other acute postprocedural pain: Secondary | ICD-10-CM

## 2020-05-27 NOTE — Telephone Encounter (Signed)
Trying to get PT set up for patient when he returns home to [place] for next week but need some guidance on what you want PT to work on. Please advise so I can try to submit order by tomorrow.

## 2020-05-27 NOTE — Telephone Encounter (Signed)
Okay for physical therapy 3-4 times a week.  Range of motion as tolerated without restriction.  Weightbearing as tolerated.  Quad and hamstring strengthening.  Okay to use blood flow restriction therapy.Marland Kitchen

## 2020-05-28 NOTE — Telephone Encounter (Signed)
Can you please document referral that this information was sent to patients athletic trainer and he will get patient scheduled for therapy? Thanks.

## 2020-05-28 NOTE — Telephone Encounter (Signed)
noted 

## 2020-05-28 NOTE — Addendum Note (Signed)
Addended byPrescott Parma on: 05/28/2020 09:45 AM   Modules accepted: Orders

## 2020-05-30 ENCOUNTER — Ambulatory Visit (HOSPITAL_COMMUNITY)
Admission: RE | Admit: 2020-05-30 | Discharge: 2020-05-30 | Disposition: A | Discharge status: Home / Self Care | Payer: 59 | Source: Ambulatory Visit | Attending: Orthopedic Surgery | Admitting: Orthopedic Surgery

## 2020-05-30 ENCOUNTER — Other Ambulatory Visit: Payer: Self-pay

## 2020-05-30 ENCOUNTER — Ambulatory Visit (INDEPENDENT_AMBULATORY_CARE_PROVIDER_SITE_OTHER): Payer: 59 | Admitting: Orthopedic Surgery

## 2020-05-30 ENCOUNTER — Encounter: Payer: Self-pay | Admitting: Orthopedic Surgery

## 2020-05-30 DIAGNOSIS — G8918 Other acute postprocedural pain: Secondary | ICD-10-CM | POA: Insufficient documentation

## 2020-05-30 DIAGNOSIS — S83511A Sprain of anterior cruciate ligament of right knee, initial encounter: Secondary | ICD-10-CM

## 2020-05-30 NOTE — Progress Notes (Signed)
Lower extremity venous has been completed.   Preliminary results in CV Proc.   Blanch Media 05/30/2020 8:23 AM

## 2020-06-01 ENCOUNTER — Encounter: Payer: Self-pay | Admitting: Orthopedic Surgery

## 2020-06-01 NOTE — Progress Notes (Signed)
   Post-Op Visit Note   Patient: Casey Shaw           Date of Birth: [birth year]           MRN: [id number] Visit Date: 05/30/2020 PCP: Patient, No Pcp Per   Assessment & Plan:  Chief Complaint:  Chief Complaint  Patient presents with  . Right Knee - Routine Post Op   Visit Diagnoses:  1. New tear of anterior cruciate ligament, right, initial encounter     Plan: Fayrene Fearing a 19 year old who is now about a week out right knee ACL reconstruction quad autograft partial lateral meniscectomy.  He is in a CPM machine.  On exam no calf tenderness.  Ultrasound negative for DVT.  On aspirin for DVT prophylaxis.  He is going to take his CPM machine home with him.  Will be back in about 3 weeks.  Would like for him to work on full extension as he is lacking about 5 degrees of full extension at this time.  Think the flexion will come.  Quad strength should also improve over next 2 to 3 weeks.  Follow-up with Mardella Layman who is the trainer when he comes back.  I will see him back in mid August as well in the training room.  Follow-Up Instructions: No follow-ups on file.   Orders:  No orders of the defined types were placed in this encounter.  No orders of the defined types were placed in this encounter.   Imaging: No results found.  PMFS History: There are no problems to display for this patient.  Past Medical History:  Diagnosis Date  . Medical history non-contributory     History reviewed. No pertinent family history.  Past Surgical History:  Procedure Laterality Date  . ANTERIOR CRUCIATE LIGAMENT REPAIR Right 05/24/2020   Procedure: RIGHT KNEE ANTERIOR CRUCIATE LIGAMENT RECONSTRUCTION WITH QUAD AUTOGRAFT, MENISCAL DEBRIDEMENT;  Surgeon: Cammy Copa, MD;  Location: [facility] OR;  Service: Orthopedics;  Laterality: Right;  . TIBIA FRACTURE SURGERY Left    Social History   Occupational History  . Not on file  Tobacco Use  . Smoking status: Never Smoker  . Smokeless tobacco: Never  Used  Vaping Use  . Vaping Use: Never used  Substance and Sexual Activity  . Alcohol use: Never  . Drug use: Never  . Sexual activity: Not on file

## 2020-06-03 ENCOUNTER — Inpatient Hospital Stay: Payer: 59 | Admitting: Orthopedic Surgery

## 2020-06-11 ENCOUNTER — Other Ambulatory Visit: Payer: 59

## 2020-06-24 ENCOUNTER — Ambulatory Visit: Payer: 59 | Admitting: Orthopedic Surgery

## 2020-07-01 ENCOUNTER — Ambulatory Visit (INDEPENDENT_AMBULATORY_CARE_PROVIDER_SITE_OTHER): Payer: 59 | Admitting: Surgical

## 2020-07-01 ENCOUNTER — Encounter: Payer: Self-pay | Admitting: Surgical

## 2020-07-01 DIAGNOSIS — S83511A Sprain of anterior cruciate ligament of right knee, initial encounter: Secondary | ICD-10-CM

## 2020-07-01 NOTE — Progress Notes (Signed)
   Post-Op Visit Note   Patient: Casey Shaw           Date of Birth: [birth year]           MRN: [id number] Visit Date: 07/01/2020 PCP: Patient, No Pcp Per   Assessment & Plan:  Chief Complaint:  Chief Complaint  Patient presents with  . Right Knee - Routine Post Op   Visit Diagnoses:  1. New tear of anterior cruciate ligament, right, initial encounter     Plan: Patient is a 19 year old male who presents s/p right knee ACL reconstruction with quadricep autograft on 05/24/2020.  Patient has recently returned from trip out to [place] where he was doing physical therapy out there.  He is doing well overall with no significant issues.  He did have some stiffness with lack of full extension according to his trainer, Homero Fellers.  Currently he is a few degrees shy of full extension with 120 degrees of flexion.  He has no significant pain, only noting pain during physical therapy.  He is full weightbearing without any sort of assistance devices.  Denies any instability, locking, fevers, chills, drainage.  Graft is stable on exam.  He has no calf tenderness and negative Denna Haggard' sign today.  Plan for patient to continue physical therapy with his trainer Homero Fellers.  Follow-up in 6 weeks for clinical recheck.  Patient agreed with plan.  Follow-Up Instructions: No follow-ups on file.   Orders:  No orders of the defined types were placed in this encounter.  No orders of the defined types were placed in this encounter.   Imaging: No results found.  PMFS History: There are no problems to display for this patient.  Past Medical History:  Diagnosis Date  . Medical history non-contributory     No family history on file.  Past Surgical History:  Procedure Laterality Date  . ANTERIOR CRUCIATE LIGAMENT REPAIR Right 05/24/2020   Procedure: RIGHT KNEE ANTERIOR CRUCIATE LIGAMENT RECONSTRUCTION WITH QUAD AUTOGRAFT, MENISCAL DEBRIDEMENT;  Surgeon: Cammy Copa, MD;  Location: [facility];  Service:  Orthopedics;  Laterality: Right;  . TIBIA FRACTURE SURGERY Left    Social History   Occupational History  . Not on file  Tobacco Use  . Smoking status: Never Smoker  . Smokeless tobacco: Never Used  Vaping Use  . Vaping Use: Never used  Substance and Sexual Activity  . Alcohol use: Never  . Drug use: Never  . Sexual activity: Not on file

## 2020-07-04 ENCOUNTER — Ambulatory Visit (INDEPENDENT_AMBULATORY_CARE_PROVIDER_SITE_OTHER): Payer: 59 | Admitting: Orthopedic Surgery

## 2020-07-04 DIAGNOSIS — S83511A Sprain of anterior cruciate ligament of right knee, initial encounter: Secondary | ICD-10-CM

## 2020-07-17 ENCOUNTER — Other Ambulatory Visit: Payer: Self-pay

## 2020-07-20 ENCOUNTER — Encounter: Payer: Self-pay | Admitting: Orthopedic Surgery

## 2020-07-20 NOTE — Progress Notes (Signed)
   Post-Op Visit Note   Patient: Casey Shaw           Date of Birth: Oct 31, 2001           MRN: [id number] Visit Date: 07/04/2020 PCP: Patient, No Pcp Per   Assessment & Plan:  Chief Complaint: No chief complaint on file.  Visit Diagnoses:  1. New tear of anterior cruciate ligament, right, initial encounter     Plan: Casey Shaw is now 5 weeks out right knee ACL reconstruction.  On exam he is less than 5 degrees full extension.  Flexion is good.  Graft is stable.  He is able to do a straight leg raise.  Overall he is improving.  Plan at this time is to continue with rehabilitation with Casey Shaw.  Casey Shaw is doing an excellent job of correcting the deficiencies from his immediate postoperative rehabilitative course back home.  Follow-up in 4 weeks for clinical recheck.  No calf tenderness negative Homans today.  Follow-Up Instructions: Return for Return to training room 4 weeks.   Orders:  No orders of the defined types were placed in this encounter.  No orders of the defined types were placed in this encounter.   Imaging: No results found.  PMFS History: There are no problems to display for this patient.  Past Medical History:  Diagnosis Date  . Medical history non-contributory     History reviewed. No pertinent family history.  Past Surgical History:  Procedure Laterality Date  . ANTERIOR CRUCIATE LIGAMENT REPAIR Right 05/24/2020   Procedure: RIGHT KNEE ANTERIOR CRUCIATE LIGAMENT RECONSTRUCTION WITH QUAD AUTOGRAFT, MENISCAL DEBRIDEMENT;  Surgeon: Cammy Copa, MD;  Location: [facility];  Service: Orthopedics;  Laterality: Right;  . TIBIA FRACTURE SURGERY Left    Social History   Occupational History  . Not on file  Tobacco Use  . Smoking status: Never Smoker  . Smokeless tobacco: Never Used  Vaping Use  . Vaping Use: Never used  Substance and Sexual Activity  . Alcohol use: Never  . Drug use: Never  . Sexual activity: Not on file

## 2020-08-05 ENCOUNTER — Ambulatory Visit (INDEPENDENT_AMBULATORY_CARE_PROVIDER_SITE_OTHER): Payer: Self-pay | Admitting: Orthopedic Surgery

## 2020-08-05 DIAGNOSIS — S83511A Sprain of anterior cruciate ligament of right knee, initial encounter: Secondary | ICD-10-CM

## 2020-08-07 ENCOUNTER — Other Ambulatory Visit: Payer: Self-pay

## 2020-08-09 ENCOUNTER — Encounter: Payer: Self-pay | Admitting: Orthopedic Surgery

## 2020-08-09 NOTE — Progress Notes (Signed)
   Post-Op Visit Note   Patient: Jaydin Foglio           Date of Birth: 05-Apr-2001           MRN: [id number] Visit Date: 08/05/2020 PCP: Patient, No Pcp Per   Assessment & Plan:  Chief Complaint: No chief complaint on file.  Visit Diagnoses:  1. New tear of anterior cruciate ligament, right, initial encounter     Plan: [name] is a patient who is now 10 and half weeks out right ACL reconstruction quad autograft.  He is doing step ups.  Going downstairs is okay.  On exam his gait has normalized significantly.  Lacks about 2 to 3 degrees of full extension but has excellent flexion.  Graft stability also excellent.  Quad strength improving.  Plan is to continue rehabilitation.  Homero Fellers has several protocols to use to focus on both strengthening and range of motion and beginning of straightahead running.  Start him on the elliptical first.  Follow-up in a couple months for clinical recheck.  Follow-Up Instructions: Return if symptoms worsen or fail to improve.   Orders:  No orders of the defined types were placed in this encounter.  No orders of the defined types were placed in this encounter.   Imaging: No results found.  PMFS History: There are no problems to display for this patient.  Past Medical History:  Diagnosis Date  . Medical history non-contributory     History reviewed. No pertinent family history.  Past Surgical History:  Procedure Laterality Date  . ANTERIOR CRUCIATE LIGAMENT REPAIR Right 05/24/2020   Procedure: RIGHT KNEE ANTERIOR CRUCIATE LIGAMENT RECONSTRUCTION WITH QUAD AUTOGRAFT, MENISCAL DEBRIDEMENT;  Surgeon: Cammy Copa, MD;  Location: [facility];  Service: Orthopedics;  Laterality: Right;  . TIBIA FRACTURE SURGERY Left    Social History   Occupational History  . Not on file  Tobacco Use  . Smoking status: Never Smoker  . Smokeless tobacco: Never Used  Vaping Use  . Vaping Use: Never used  Substance and Sexual Activity  . Alcohol use: Never  . Drug  use: Never  . Sexual activity: Not on file

## 2020-09-09 ENCOUNTER — Ambulatory Visit (INDEPENDENT_AMBULATORY_CARE_PROVIDER_SITE_OTHER): Payer: Self-pay | Admitting: Orthopedic Surgery

## 2020-09-09 DIAGNOSIS — S83511A Sprain of anterior cruciate ligament of right knee, initial encounter: Secondary | ICD-10-CM

## 2020-10-31 ENCOUNTER — Other Ambulatory Visit: Payer: Self-pay

## 2020-11-11 ENCOUNTER — Encounter: Payer: Self-pay | Admitting: Orthopedic Surgery

## 2020-11-11 NOTE — Progress Notes (Signed)
   Post-Op Visit Note   Patient: Casey Shaw           Date of Birth: [birth year]           MRN: [id number] Visit Date: 09/09/2020 PCP: Patient, No Pcp Per   Assessment & Plan:  Chief Complaint: No chief complaint on file.  Visit Diagnoses:  1. New tear of anterior cruciate ligament, right, initial encounter     Plan: [name] is a patient who underwent quad ACL at the beginning of the season.  He has been doing well.  On exam he lacks about 3 to 4 degrees of full extension.  Quad strength is improving.  He is doing BFR 2-3 times a week.  We will let him start doing some more agility type exercises.  Jump shot okay.  Strength is improving.  No effusion.  Graft has excellent stability.  Come back in 4 weeks for clinical recheck and progression.  Follow-Up Instructions: Return if symptoms worsen or fail to improve.   Orders:  No orders of the defined types were placed in this encounter.  No orders of the defined types were placed in this encounter.   Imaging: No results found.  PMFS History: There are no problems to display for this patient.  Past Medical History:  Diagnosis Date  . Medical history non-contributory     No family history on file.  Past Surgical History:  Procedure Laterality Date  . ANTERIOR CRUCIATE LIGAMENT REPAIR Right 05/24/2020   Procedure: RIGHT KNEE ANTERIOR CRUCIATE LIGAMENT RECONSTRUCTION WITH QUAD AUTOGRAFT, MENISCAL DEBRIDEMENT;  Surgeon: Cammy Copa, MD;  Location: [facility];  Service: Orthopedics;  Laterality: Right;  . TIBIA FRACTURE SURGERY Left    Social History   Occupational History  . Not on file  Tobacco Use  . Smoking status: Never Smoker  . Smokeless tobacco: Never Used  Vaping Use  . Vaping Use: Never used  Substance and Sexual Activity  . Alcohol use: Never  . Drug use: Never  . Sexual activity: Not on file

## 2020-11-20 ENCOUNTER — Ambulatory Visit (INDEPENDENT_AMBULATORY_CARE_PROVIDER_SITE_OTHER): Payer: Self-pay | Admitting: Orthopedic Surgery

## 2020-11-20 DIAGNOSIS — S83511A Sprain of anterior cruciate ligament of right knee, initial encounter: Secondary | ICD-10-CM

## 2020-12-02 ENCOUNTER — Ambulatory Visit (INDEPENDENT_AMBULATORY_CARE_PROVIDER_SITE_OTHER): Payer: Self-pay | Admitting: Orthopedic Surgery

## 2020-12-02 DIAGNOSIS — S83511A Sprain of anterior cruciate ligament of right knee, initial encounter: Secondary | ICD-10-CM

## 2020-12-10 ENCOUNTER — Other Ambulatory Visit: Payer: Self-pay

## 2020-12-11 ENCOUNTER — Other Ambulatory Visit: Payer: Self-pay

## 2020-12-22 ENCOUNTER — Encounter: Payer: Self-pay | Admitting: Orthopedic Surgery

## 2020-12-22 NOTE — Progress Notes (Signed)
   Post-Op Visit Note   Patient: Casey Shaw           Date of Birth: 01/22/2001           MRN: [id number] Visit Date: 11/20/2020 PCP: Patient, No Pcp Per   Assessment & Plan:  Chief Complaint: No chief complaint on file.  Visit Diagnoses: No diagnosis found.  Plan: [name] is a 20 year old patient 7 months out quad ACL right leg.  He is doing full weight room squats.  Doing straightahead running.  Starting to do lateral movement.  Starting shooting.  On exam he has about 3 degree flexion contracture no effusion very stable graft and significantly improved quad strength.  85-month return and anticipate release to play at that time.  Follow-Up Instructions: No follow-ups on file.   Orders:  No orders of the defined types were placed in this encounter.  No orders of the defined types were placed in this encounter.   Imaging: No results found.  PMFS History: There are no problems to display for this patient.  Past Medical History:  Diagnosis Date  . Medical history non-contributory     No family history on file.  Past Surgical History:  Procedure Laterality Date  . ANTERIOR CRUCIATE LIGAMENT REPAIR Right 05/24/2020   Procedure: RIGHT KNEE ANTERIOR CRUCIATE LIGAMENT RECONSTRUCTION WITH QUAD AUTOGRAFT, MENISCAL DEBRIDEMENT;  Surgeon: Cammy Copa, MD;  Location: [facility];  Service: Orthopedics;  Laterality: Right;  . TIBIA FRACTURE SURGERY Left    Social History   Occupational History  . Not on file  Tobacco Use  . Smoking status: Never Smoker  . Smokeless tobacco: Never Used  Vaping Use  . Vaping Use: Never used  Substance and Sexual Activity  . Alcohol use: Never  . Drug use: Never  . Sexual activity: Not on file

## 2020-12-22 NOTE — Progress Notes (Signed)
   Post-Op Visit Note   Patient: Casey Shaw           Date of Birth: December 29, 2000           MRN: [id number] Visit Date: 12/02/2020 PCP: Patient, No Pcp Per   Assessment & Plan:  Chief Complaint: No chief complaint on file.  Visit Diagnoses:  1. New tear of anterior cruciate ligament, right, initial encounter     Plan: Please see the clinic note from approximately 10 days ago on Casey Shaw.  He is about 7 months out from quad ACL doing well.  Plan 54-month return with continued progression of activity.  He was not seen on this day.  Follow-Up Instructions: No follow-ups on file.   Orders:  No orders of the defined types were placed in this encounter.  No orders of the defined types were placed in this encounter.   Imaging: No results found.  PMFS History: There are no problems to display for this patient.  Past Medical History:  Diagnosis Date  . Medical history non-contributory     No family history on file.  Past Surgical History:  Procedure Laterality Date  . ANTERIOR CRUCIATE LIGAMENT REPAIR Right 05/24/2020   Procedure: RIGHT KNEE ANTERIOR CRUCIATE LIGAMENT RECONSTRUCTION WITH QUAD AUTOGRAFT, MENISCAL DEBRIDEMENT;  Surgeon: Cammy Copa, MD;  Location: [facility];  Service: Orthopedics;  Laterality: Right;  . TIBIA FRACTURE SURGERY Left    Social History   Occupational History  . Not on file  Tobacco Use  . Smoking status: Never Smoker  . Smokeless tobacco: Never Used  Vaping Use  . Vaping Use: Never used  Substance and Sexual Activity  . Alcohol use: Never  . Drug use: Never  . Sexual activity: Not on file

## 2021-02-03 ENCOUNTER — Ambulatory Visit (INDEPENDENT_AMBULATORY_CARE_PROVIDER_SITE_OTHER): Payer: Self-pay | Admitting: Orthopedic Surgery

## 2021-02-03 DIAGNOSIS — S83511A Sprain of anterior cruciate ligament of right knee, initial encounter: Secondary | ICD-10-CM

## 2021-02-09 ENCOUNTER — Encounter: Payer: Self-pay | Admitting: Orthopedic Surgery

## 2021-02-09 NOTE — Progress Notes (Signed)
   Post-Op Visit Note   Patient: Casey Shaw           Date of Birth: Jul 06, 2001           MRN: [id number] Visit Date: 02/03/2021 PCP: Patient, No Pcp Per (Inactive)   Assessment & Plan:  Chief Complaint: No chief complaint on file.  Visit Diagnoses:  1. New tear of anterior cruciate ligament, right, initial encounter     Plan: Patient presents now 8 months out right knee ACL reconstruction.  Had quad autograft.  Patient's been lifting and working out.  Doing well with squats.  Has a little bit of soreness after practice.  Has not started doing too much running yet.  He will be around except for 1 month this summer.  Leaving April 29 but will be back May 23.  Studying psychology.  On exam he is lacking about 2 degrees of full extension but has excellent flexion and improving quad strength.  No effusion.  Graft stable.  66-month return.  Continue with range of motion strengthening continue with elliptical and treadmill running for conditioning.  Anticipate practice when he returns.  Do want him to continue to work on leg strengthening.  Follow-Up Instructions: No follow-ups on file.   Orders:  No orders of the defined types were placed in this encounter.  No orders of the defined types were placed in this encounter.   Imaging: No results found.  PMFS History: There are no problems to display for this patient.  Past Medical History:  Diagnosis Date  . Medical history non-contributory     No family history on file.  Past Surgical History:  Procedure Laterality Date  . ANTERIOR CRUCIATE LIGAMENT REPAIR Right 05/24/2020   Procedure: RIGHT KNEE ANTERIOR CRUCIATE LIGAMENT RECONSTRUCTION WITH QUAD AUTOGRAFT, MENISCAL DEBRIDEMENT;  Surgeon: Cammy Copa, MD;  Location: [facility];  Service: Orthopedics;  Laterality: Right;  . TIBIA FRACTURE SURGERY Left    Social History   Occupational History  . Not on file  Tobacco Use  . Smoking status: Never Smoker  . Smokeless tobacco:  Never Used  Vaping Use  . Vaping Use: Never used  Substance and Sexual Activity  . Alcohol use: Never  . Drug use: Never  . Sexual activity: Not on file

## 2021-04-10 IMAGING — RF DG C-ARM 1-60 MIN
1 series · 1 of 1 positions shown · non-contrast
Comparison: 05/01/2020

CLINICAL DATA: Intraoperative exam

EXAM:
RIGHT KNEE - 3 VIEW; DG C-ARM 1-60 MIN

[Series 1: unknown protocol · 0.14mm/px · 1 of 1 slices shown]
[im 1/1]
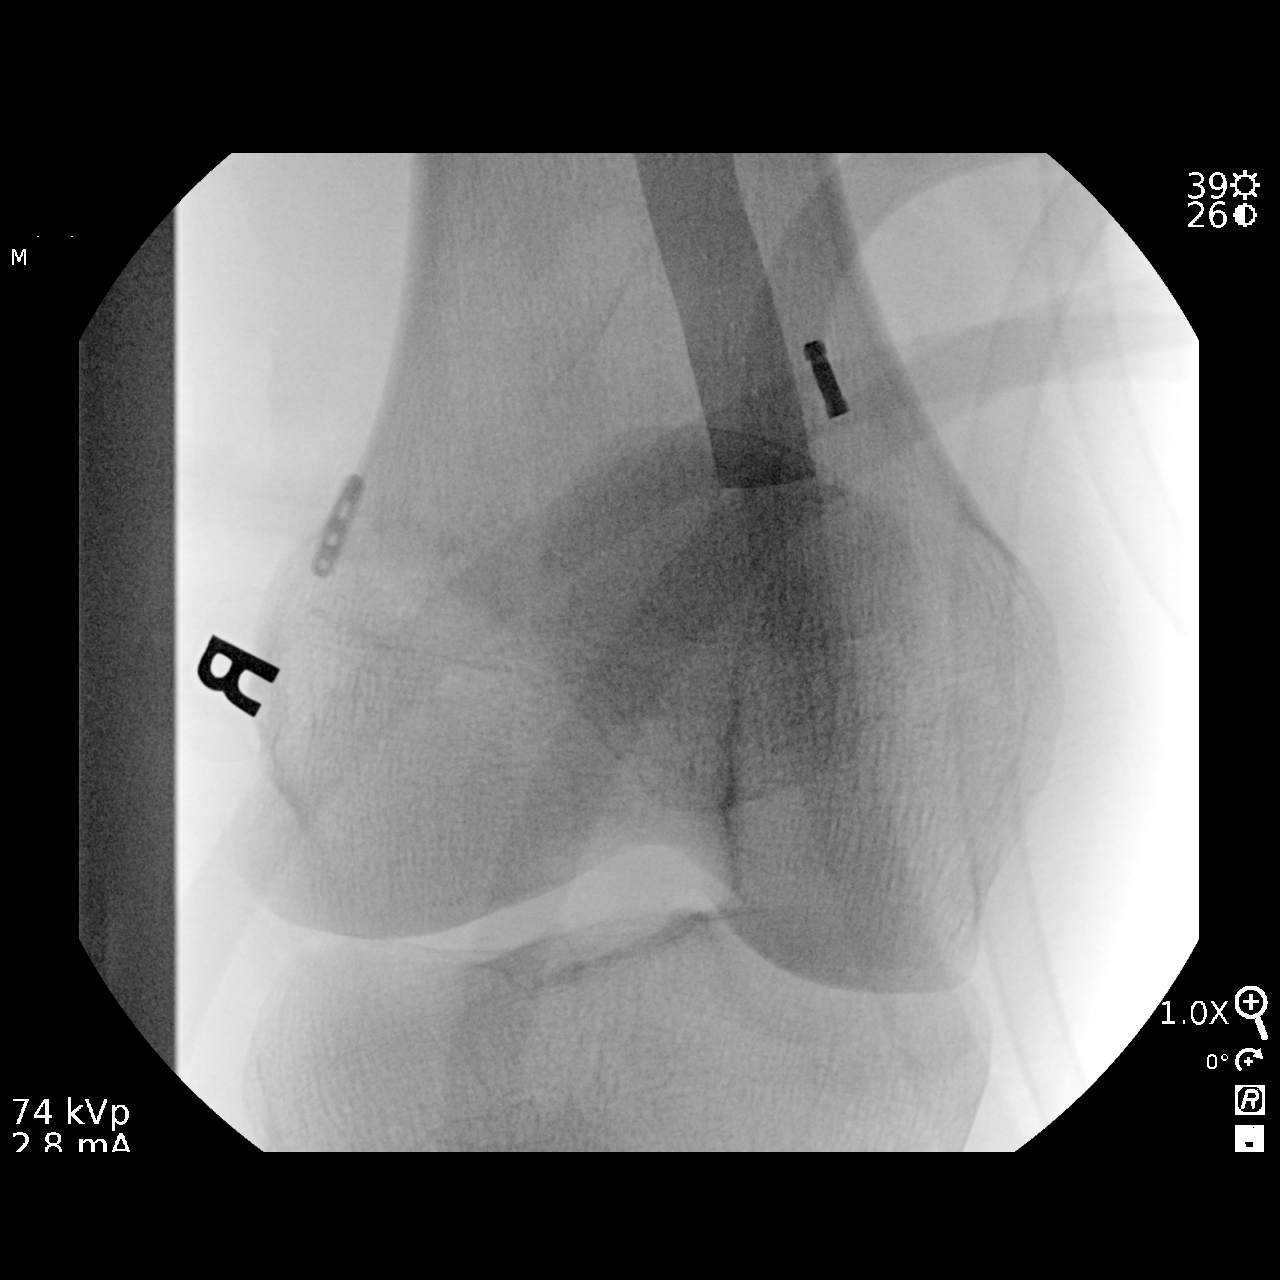

[1 of 1 positions shown; findings below may reference images not displayed]

FINDINGS: A single fluoroscopic images obtained during the performance of
procedure and is provided for interpretation only. Surgical anchor
is seen at the lateral femoral metaphysis. Alignment is anatomic.
Please refer to operative report.

FLUOROSCOPY TIME:  0.2 seconds, 1 image
IMPRESSION: 1. Intraoperative exam as above.

## 2021-04-10 IMAGING — RF DG KNEE 3 VIEWS*R*
1 series · 1 of 1 positions shown · non-contrast
Comparison: 05/01/2020

CLINICAL DATA: Intraoperative exam

EXAM:
RIGHT KNEE - 3 VIEW; DG C-ARM 1-60 MIN

[Series 1: unknown protocol · 0.14mm/px · 1 of 1 slices shown]
[im 1/1]
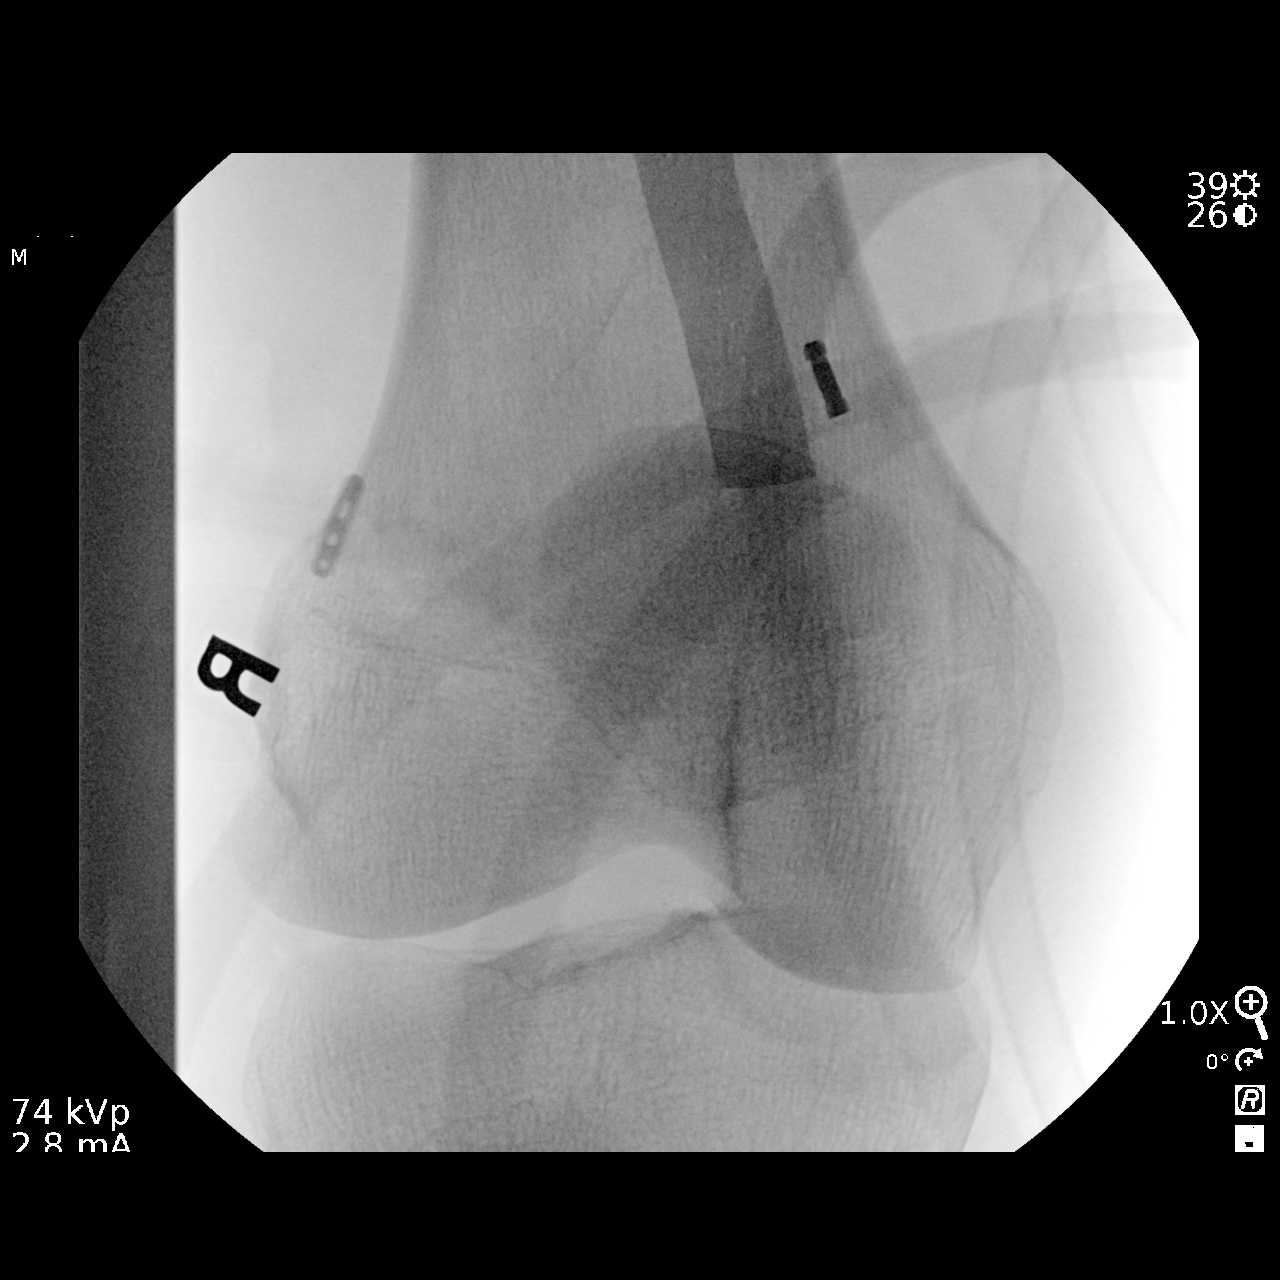

[1 of 1 positions shown; findings below may reference images not displayed]

FINDINGS: A single fluoroscopic images obtained during the performance of
procedure and is provided for interpretation only. Surgical anchor
is seen at the lateral femoral metaphysis. Alignment is anatomic.
Please refer to operative report.

FLUOROSCOPY TIME:  0.2 seconds, 1 image
IMPRESSION: 1. Intraoperative exam as above.

## 2021-05-27 ENCOUNTER — Ambulatory Visit (INDEPENDENT_AMBULATORY_CARE_PROVIDER_SITE_OTHER): Payer: 59 | Admitting: Orthopedic Surgery

## 2021-05-27 DIAGNOSIS — S83511A Sprain of anterior cruciate ligament of right knee, initial encounter: Secondary | ICD-10-CM

## 2021-07-02 ENCOUNTER — Other Ambulatory Visit: Payer: Self-pay

## 2021-07-04 ENCOUNTER — Encounter: Payer: Self-pay | Admitting: Orthopedic Surgery

## 2021-07-04 NOTE — Progress Notes (Signed)
   Post-Op Visit Note   Patient: Casey Shaw           Date of Birth: 13-Feb-2001           MRN: [id number] Visit Date: 05/27/2021 PCP: Patient, No Pcp Per (Inactive)   Assessment & Plan:  Chief Complaint: No chief complaint on file.  Visit Diagnoses:  1. New tear of anterior cruciate ligament, right, initial encounter     Plan: Patient is now a year out right knee ACL reconstruction with quad autograft.  Having mild posterior knee pain occasionally.  Ibuprofen helps.  On exam he has slight flexion contracture of about 5 degrees but bends to full flexion with excellent graft stability and no effusion.  He is going home and will be back in about a month.  We can follow-up then and decide about the need for any further imaging studies but at this time knee is stable with no effusion and he is progressing well with practice.  Follow-Up Instructions: Return if symptoms worsen or fail to improve.   Orders:  No orders of the defined types were placed in this encounter.  No orders of the defined types were placed in this encounter.   Imaging: No results found.  PMFS History: There are no problems to display for this patient.  Past Medical History:  Diagnosis Date   Medical history non-contributory     History reviewed. No pertinent family history.  Past Surgical History:  Procedure Laterality Date   ANTERIOR CRUCIATE LIGAMENT REPAIR Right 05/24/2020   Procedure: RIGHT KNEE ANTERIOR CRUCIATE LIGAMENT RECONSTRUCTION WITH QUAD AUTOGRAFT, MENISCAL DEBRIDEMENT;  Surgeon: Cammy Copa, MD;  Location: [facility];  Service: Orthopedics;  Laterality: Right;   TIBIA FRACTURE SURGERY Left    Social History   Occupational History   Not on file  Tobacco Use   Smoking status: Never   Smokeless tobacco: Never  Vaping Use   Vaping Use: Never used  Substance and Sexual Activity   Alcohol use: Never   Drug use: Never   Sexual activity: Not on file

## 2022-07-20 ENCOUNTER — Ambulatory Visit (INDEPENDENT_AMBULATORY_CARE_PROVIDER_SITE_OTHER): Payer: 59 | Admitting: Orthopedic Surgery

## 2022-07-20 DIAGNOSIS — M25532 Pain in left wrist: Secondary | ICD-10-CM

## 2022-07-28 ENCOUNTER — Encounter: Payer: Self-pay | Admitting: Orthopedic Surgery

## 2022-07-28 NOTE — Progress Notes (Signed)
   Post-Op Visit Note   Patient: Casey Shaw           Date of Birth: 07/26/2001           MRN: [id number] Visit Date: 07/20/2022 PCP: Patient, No Pcp Per   Assessment & Plan:  Chief Complaint: No chief complaint on file.  Visit Diagnoses:  1. Pain in left wrist     Plan: Patient presents for evaluation of left wrist pain.  Denies a history of injury but does report some pain starting about 3 weeks ago.  He placements basketball.  Hurts for him to weight lift some particularly with dorsiflexion of the wrist.  Bench and overhead press are also painful.  Has some pain with dribbling on that left-hand side.  Ibuprofen has not been particularly helpful.  On examination he does have some pain on the dorsal ulnar aspect of the wrist.  ECU FCU and radial wrist extensors palpable intact and nontender.  No effusion in the wrist joint.  No snuffbox tenderness or tenderness over the radial styloid.  I will detect any coarseness or grinding with loaded range of motion of the TFCC.  Fluoroscopic imaging unremarkable with neutral ulnar variance.  Lunate without cystic changes.  Plan at this time is wrist plain unclear etiology could be TFCC tear.  Plan is Medrol Dosepak with MRI arthrogram 1 week if not improved.  Follow-Up Instructions: No follow-ups on file.   Orders:  No orders of the defined types were placed in this encounter.  No orders of the defined types were placed in this encounter.   Imaging: No results found.  PMFS History: There are no problems to display for this patient.  Past Medical History:  Diagnosis Date   Medical history non-contributory     No family history on file.  Past Surgical History:  Procedure Laterality Date   ANTERIOR CRUCIATE LIGAMENT REPAIR Right 05/24/2020   Procedure: RIGHT KNEE ANTERIOR CRUCIATE LIGAMENT RECONSTRUCTION WITH QUAD AUTOGRAFT, MENISCAL DEBRIDEMENT;  Surgeon: Meredith Pel, MD;  Location: [facility];  Service: Orthopedics;  Laterality:  Right;   TIBIA FRACTURE SURGERY Left    Social History   Occupational History   Not on file  Tobacco Use   Smoking status: Never   Smokeless tobacco: Never  Vaping Use   Vaping Use: Never used  Substance and Sexual Activity   Alcohol use: Never   Drug use: Never   Sexual activity: Not on file

## 2022-08-04 ENCOUNTER — Ambulatory Visit (INDEPENDENT_AMBULATORY_CARE_PROVIDER_SITE_OTHER): Payer: 59 | Admitting: Orthopedic Surgery

## 2022-08-04 DIAGNOSIS — M25532 Pain in left wrist: Secondary | ICD-10-CM

## 2022-08-05 ENCOUNTER — Encounter: Payer: Self-pay | Admitting: Orthopedic Surgery

## 2022-08-05 ENCOUNTER — Other Ambulatory Visit: Payer: Self-pay

## 2022-08-05 DIAGNOSIS — M25532 Pain in left wrist: Secondary | ICD-10-CM

## 2022-08-05 NOTE — Progress Notes (Signed)
   Post-Op Visit Note   Patient: Casey Shaw           Date of Birth: [birth year]           MRN: [id number] Visit Date: 08/04/2022 PCP: Patient, No Pcp Per   Assessment & Plan:  Chief Complaint: No chief complaint on file.  Visit Diagnoses: No diagnosis found.  Plan: I talked the seeing the trainer about Jalyn.  He has been able to continue to practice but the Medrol Dosepak did not give him sustained relief for his wrist pain which again is worse in dorsiflexion compared to palmar flexion.  Concern at this time would be for TFCC injury as most of his pain is ulnar-sided.  Kienbck's disease is also a consideration.  Plan MRI arthrogram left wrist to evaluate both TFCC and lunate as well as the scapholunate ligament.  Follow-up after that study.  Follow-Up Instructions: No follow-ups on file.   Orders:  No orders of the defined types were placed in this encounter.  No orders of the defined types were placed in this encounter.   Imaging: No results found.  PMFS History: There are no problems to display for this patient.  Past Medical History:  Diagnosis Date   Medical history non-contributory     No family history on file.  Past Surgical History:  Procedure Laterality Date   ANTERIOR CRUCIATE LIGAMENT REPAIR Right 05/24/2020   Procedure: RIGHT KNEE ANTERIOR CRUCIATE LIGAMENT RECONSTRUCTION WITH QUAD AUTOGRAFT, MENISCAL DEBRIDEMENT;  Surgeon: Meredith Pel, MD;  Location: [facility];  Service: Orthopedics;  Laterality: Right;   TIBIA FRACTURE SURGERY Left    Social History   Occupational History   Not on file  Tobacco Use   Smoking status: Never   Smokeless tobacco: Never  Vaping Use   Vaping Use: Never used  Substance and Sexual Activity   Alcohol use: Never   Drug use: Never   Sexual activity: Not on file

## 2022-08-07 ENCOUNTER — Telehealth: Payer: Self-pay | Admitting: Orthopedic Surgery

## 2022-08-07 NOTE — Telephone Encounter (Signed)
Called pt no vm. Pt need to set an appt with Dr Marlou Sa for MRI Review after 08/25/22

## 2022-08-25 ENCOUNTER — Ambulatory Visit
Admission: RE | Admit: 2022-08-25 | Discharge: 2022-08-25 | Disposition: A | Discharge status: Home / Self Care | Payer: 59 | Source: Ambulatory Visit | Attending: Orthopedic Surgery | Admitting: Orthopedic Surgery

## 2022-08-25 DIAGNOSIS — M25532 Pain in left wrist: Secondary | ICD-10-CM

## 2022-08-25 MED ORDER — IOPAMIDOL (ISOVUE-M 200) INJECTION 41%
1.0000 mL | Freq: Once | INTRAMUSCULAR | Status: AC
  Administered 2022-08-25: 1 mL via INTRA_ARTICULAR

## 2022-09-07 ENCOUNTER — Ambulatory Visit (INDEPENDENT_AMBULATORY_CARE_PROVIDER_SITE_OTHER): Payer: Self-pay | Admitting: Orthopedic Surgery

## 2022-09-07 DIAGNOSIS — M25532 Pain in left wrist: Secondary | ICD-10-CM

## 2022-10-03 ENCOUNTER — Encounter: Payer: Self-pay | Admitting: Orthopedic Surgery

## 2022-10-03 NOTE — Progress Notes (Signed)
   Post-Op Visit Note   Patient: Casey Shaw           Date of Birth: 09/14/2001           MRN: [id number] Visit Date: 09/07/2022 PCP: Patient, No Pcp Per   Assessment & Plan:  Chief Complaint: No chief complaint on file.  Visit Diagnoses:  1. Pain in left wrist     Plan: Patient presents for evaluation of left wrist.  Since he was last seen he had an MRI scan which shows a little bit of mild edema in the ulna.  Dorsiflexion remains painful in the wrist.  Overall his symptoms are staying the same.  He did try a Dosepak.  Plan at this time is still with Mobic 15 mg a day for 14 days then as needed.  We will also try a topical and then consider an injection.  We would do that aiming for 10 November depending on his response to the Mobic.  Nothing operative in the wrist at this time.  Follow-Up Instructions: No follow-ups on file.   Orders:  No orders of the defined types were placed in this encounter.  No orders of the defined types were placed in this encounter.   Imaging: No results found.  PMFS History: There are no problems to display for this patient.  Past Medical History:  Diagnosis Date   Medical history non-contributory     No family history on file.  Past Surgical History:  Procedure Laterality Date   ANTERIOR CRUCIATE LIGAMENT REPAIR Right 05/24/2020   Procedure: RIGHT KNEE ANTERIOR CRUCIATE LIGAMENT RECONSTRUCTION WITH QUAD AUTOGRAFT, MENISCAL DEBRIDEMENT;  Surgeon: Cammy Copa, MD;  Location: [facility];  Service: Orthopedics;  Laterality: Right;   TIBIA FRACTURE SURGERY Left    Social History   Occupational History   Not on file  Tobacco Use   Smoking status: Never   Smokeless tobacco: Never  Vaping Use   Vaping Use: Never used  Substance and Sexual Activity   Alcohol use: Never   Drug use: Never   Sexual activity: Not on file
